# Patient Record
Sex: Male | Born: 1995 | Race: Black or African American | Hispanic: No | Marital: Single | Smoking: Never smoker
Health system: Southern US, Community
[De-identification: ages and names within clinical notes are randomized; demographics above are authoritative.]

## PROBLEM LIST (undated history)

## (undated) DIAGNOSIS — J45909 Unspecified asthma, uncomplicated: Secondary | ICD-10-CM

---
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
## 2019-02-12 ENCOUNTER — Emergency Department (HOSPITAL_BASED_OUTPATIENT_CLINIC_OR_DEPARTMENT_OTHER)
Admission: EM | Admit: 2019-02-12 | Discharge: 2019-02-12 | Disposition: A | Discharge status: Home / Self Care | Payer: Self-pay | Attending: Emergency Medicine | Admitting: Emergency Medicine

## 2019-02-12 ENCOUNTER — Encounter (HOSPITAL_BASED_OUTPATIENT_CLINIC_OR_DEPARTMENT_OTHER): Payer: Self-pay

## 2019-02-12 ENCOUNTER — Emergency Department (HOSPITAL_BASED_OUTPATIENT_CLINIC_OR_DEPARTMENT_OTHER): Payer: Self-pay

## 2019-02-12 ENCOUNTER — Other Ambulatory Visit: Payer: Self-pay

## 2019-02-12 DIAGNOSIS — J45909 Unspecified asthma, uncomplicated: Secondary | ICD-10-CM | POA: Insufficient documentation

## 2019-02-12 DIAGNOSIS — R197 Diarrhea, unspecified: Secondary | ICD-10-CM | POA: Insufficient documentation

## 2019-02-12 DIAGNOSIS — K5900 Constipation, unspecified: Secondary | ICD-10-CM | POA: Insufficient documentation

## 2019-02-12 DIAGNOSIS — R112 Nausea with vomiting, unspecified: Secondary | ICD-10-CM | POA: Insufficient documentation

## 2019-02-12 HISTORY — DX: Unspecified asthma, uncomplicated: J45.909

## 2019-02-12 LAB — COMPREHENSIVE METABOLIC PANEL
ALT: 17 U/L (ref 0–44)
AST: 18 U/L (ref 15–41)
Albumin: 4.5 g/dL (ref 3.5–5.0)
Alkaline Phosphatase: 69 U/L (ref 38–126)
Anion gap: 11 (ref 5–15)
BUN: 8 mg/dL (ref 6–20)
CO2: 28 mmol/L (ref 22–32)
Calcium: 9.4 mg/dL (ref 8.9–10.3)
Chloride: 102 mmol/L (ref 98–111)
Creatinine, Ser: 0.89 mg/dL (ref 0.61–1.24)
GFR calc Af Amer: >60 mL/min (ref >60)
GFR calc non Af Amer: >60 mL/min (ref >60)
Glucose, Bld: 109 mg/dL — ABNORMAL HIGH (ref 70–99)
Potassium: 3.2 mmol/L — ABNORMAL LOW (ref 3.5–5.1)
Sodium: 141 mmol/L (ref 135–145)
Total Bilirubin: 1 mg/dL (ref 0.3–1.2)
Total Protein: 7.9 g/dL (ref 6.5–8.1)

## 2019-02-12 LAB — CBC WITH DIFFERENTIAL/PLATELET
Abs Immature Granulocytes: 0.02 10*3/uL (ref 0.00–0.07)
Basophils Absolute: 0.1 10*3/uL (ref 0.0–0.1)
Basophils Relative: 1 %
Eosinophils Absolute: 0.2 10*3/uL (ref 0.0–0.5)
Eosinophils Relative: 2 %
HCT: 50.6 % (ref 39.0–52.0)
Hemoglobin: 16.1 g/dL (ref 13.0–17.0)
Immature Granulocytes: 0 %
Lymphocytes Relative: 13 %
Lymphs Abs: 1.5 10*3/uL (ref 0.7–4.0)
MCH: 26.6 pg (ref 26.0–34.0)
MCHC: 31.8 g/dL (ref 30.0–36.0)
MCV: 83.6 fL (ref 80.0–100.0)
Monocytes Absolute: 0.6 10*3/uL (ref 0.1–1.0)
Monocytes Relative: 6 %
Neutro Abs: 8.8 10*3/uL — ABNORMAL HIGH (ref 1.7–7.7)
Neutrophils Relative %: 78 %
Platelets: 308 10*3/uL (ref 150–400)
RBC: 6.05 MIL/uL — ABNORMAL HIGH (ref 4.22–5.81)
RDW: 13.2 % (ref 11.5–15.5)
WBC: 11.2 10*3/uL — ABNORMAL HIGH (ref 4.0–10.5)
nRBC: 0 % (ref 0.0–0.2)

## 2019-02-12 LAB — LIPASE, BLOOD: Lipase: 25 U/L (ref 11–51)

## 2019-02-12 MED ORDER — POLYETHYLENE GLYCOL 3350 17 G PO PACK: 17.0000 g | PACK | Freq: Every day | ORAL | 0 refills | Status: AC

## 2019-02-12 MED ORDER — POLYETHYLENE GLYCOL 3350 17 G PO PACK
17.0000 g | PACK | Freq: Every day | ORAL | Status: DC
  Filled 2019-02-12: qty 1

## 2019-02-12 MED ORDER — ONDANSETRON 8 MG PO TBDP: 8.0000 mg | ORAL_TABLET | Freq: Three times a day (TID) | ORAL | 0 refills | Status: AC | PRN

## 2019-02-12 MED ORDER — BISACODYL 10 MG RE SUPP: 10.0000 mg | RECTAL | 0 refills | Status: AC | PRN

## 2019-02-12 NOTE — ED Notes (Signed)
Pt to xray

## 2019-02-12 NOTE — ED Triage Notes (Signed)
Pt states starting Monday, abdominal pain, constipation, vomiting.  Took pepto bismal and mag citrate, unable to move bowels regularly.  Small bowel movement yesterday, states went a week without bm prior to that.  Vomited x2 today prior to arrival.  Denies visible blood. Passing gas

## 2019-02-12 NOTE — Discharge Instructions (Addendum)
We saw in the ER for constipation, nausea, vomiting.  At this time the emergency room work-up does not reveal any concerning findings.  We recommend that you take the nausea medicine along with the medications for constipation.  We also recommend that you increase fiber in your diet and drink plenty of water.  Return to the emergency room if you are unable to tolerate fluids or if your symptoms of pain get worse.  Also return to the ER if you are unable to pass gas.

## 2019-02-12 NOTE — ED Provider Notes (Signed)
[facility] EMERGENCY DEPARTMENT Provider Note   CSN: [id number] Arrival date & time: 02/12/19  0849    History   Chief Complaint Chief Complaint  Patient presents with  . Abdominal Pain  . Emesis    HPI Donald Tapia is a 23 y.o. male.     HPI 23 year old male with history of asthma comes in with chief complaint of constipation, vomiting. Patient reports that over the past 1 week he has noted some constipation.  He has been taking mag citrate over the past 2 days without significant relief.  He had passed a small amount of stool yesterday with straining.  He is passing flatus.  Patient has abdominal discomfort that is periumbilical.  He also has nausea and vomiting.  Patient reports that abdominal pain, nausea are provoked by p.o. intake and all of his episodes of emesis were postprandial.  He has no history of abdominal surgeries.  No history of similar pain.  In general he does not suffer from constipation.  Patient denies any narcotic pain use or substance abuse.  Past Medical History:  Diagnosis Date  . Asthma     There are no active problems to display for this patient.   History reviewed. No pertinent surgical history.      Home Medications    Prior to Admission medications   Medication Sig Start Date End Date Taking? Authorizing Provider  albuterol (VENTOLIN HFA) 108 (90 Base) MCG/ACT inhaler Inhale 2 puffs into the lungs every 4 (four) hours as needed for wheezing or shortness of breath.   Yes [provider]  bisacodyl (DULCOLAX) 10 MG suppository Place 1 suppository (10 mg total) rectally as needed for moderate constipation. 02/12/19   Derwood KaplanNanavati, Hagar Sadiq, MD  ondansetron (ZOFRAN ODT) 8 MG disintegrating tablet Take 1 tablet (8 mg total) by mouth every 8 (eight) hours as needed for nausea. 02/12/19   Derwood KaplanNanavati, Conor Lata, MD  polyethylene glycol (MIRALAX / GLYCOLAX) 17 g packet Take 17 g by mouth daily. 02/12/19   Derwood KaplanNanavati, Brihanna Devenport, MD    Family History  History reviewed. No pertinent family history.  Social History Social History   Tobacco Use  . Smoking status: Never Smoker  . Smokeless tobacco: Never Used  Substance Use Topics  . Alcohol use: Not Currently  . Drug use: Not Currently     Allergies   Patient has no known allergies.   Review of Systems Review of Systems  Constitutional: Positive for activity change.  Gastrointestinal: Positive for abdominal pain, constipation, nausea and vomiting.  Genitourinary: Negative for flank pain.  Neurological: Positive for light-headedness.  All other systems reviewed and are negative.    Physical Exam Updated Vital Signs BP 109/84 (BP Location: Right Arm)   Pulse 78   Temp 98.2 F (36.8 C) (Oral)   Resp 16   Ht 5\' 2"  (1.575 m)   Wt 59 kg   SpO2 100%   BMI 23.78 kg/m   Physical Exam Vitals signs and nursing note reviewed.  Constitutional:      Appearance: He is well-developed.  HENT:     Head: Atraumatic.  Neck:     Musculoskeletal: Neck supple.  Cardiovascular:     Rate and Rhythm: Normal rate.  Pulmonary:     Effort: Pulmonary effort is normal.  Abdominal:     Tenderness: There is abdominal tenderness in the epigastric area and periumbilical area. There is no guarding or rebound.  Skin:    General: Skin is warm.  Neurological:  Mental Status: He is alert and oriented to person, place, and time.      ED Treatments / Results  Labs (all labs ordered are listed, but only abnormal results are displayed) Labs Reviewed  CBC WITH DIFFERENTIAL/PLATELET - Abnormal; Notable for the following components:      Result Value   WBC 11.2 (*)    RBC 6.05 (*)    Neutro Abs 8.8 (*)    All other components within normal limits  COMPREHENSIVE METABOLIC PANEL - Abnormal; Notable for the following components:   Potassium 3.2 (*)    Glucose, Bld 109 (*)    All other components within normal limits  LIPASE, BLOOD    EKG None  Radiology Dg Abd Acute 2+v W 1v  Chest  Result Date: 02/12/2019 CLINICAL DATA:  Constipation, generalized abdominal pain, vomiting. EXAM: DG ABDOMEN ACUTE W/ 1V CHEST COMPARISON:  Radiographs of January 25, 2018. FINDINGS: There is no evidence of dilated bowel loops or free intraperitoneal air. No radiopaque calculi or other significant radiographic abnormality is seen. Heart size and mediastinal contours are within normal limits. Both lungs are clear. IMPRESSION: No evidence of bowel obstruction or ileus. No significant stool burden is noted. No acute cardiopulmonary disease. Electronically Signed   By: Lupita Raider M.D.   On: 02/12/2019 09:33    Procedures Procedures (including critical care time)  Medications Ordered in ED Medications  polyethylene glycol (MIRALAX / GLYCOLAX) packet 17 g (17 g Oral Not Given 02/12/19 1017)     Initial Impression / Assessment and Plan / ED Course  I have reviewed the triage vital signs and the nursing notes.  Pertinent labs & imaging results that were available during my care of the patient were reviewed by me and considered in my medical decision making (see chart for details).  Clinical Course as of Feb 11 1050  Wed Feb 12, 2019  1047 Pt reassessed. Pt's VSS and WNL. Pt's cap refill < 3 seconds. Pt has been hydrated in the ER and now passed po challenge. We will discharge with antiemetic. Strict ER return precautions have been discussed and pt will return if he is unable to tolerate fluids and symptoms are getting worse.    [AN]    Clinical Course User Index [AN] Derwood Kaplan, MD       23 year old male comes in with chief complaint of abdominal pain, nausea, vomiting and constipation.  It appears that he has been constipated for the last 1 week which is new for him.  Now he is having abdominal discomfort, nausea and vomiting with p.o. intake.  He also reports some dizziness.  There is no abdominal surgical history.  Patient's abdominal exam does not reveal any focal findings  of guarding or rebound and in general there is no peritoneal findings.  Plan is to get acute abdominal series and basic labs.  We will hydrate the patient in the ED.  He does not have any cough, chest pain, shortness of breath, fevers, chills, anosmia -thus we do not think patient has COVID-19 infection.  Final Clinical Impressions(s) / ED Diagnoses   Final diagnoses:  Constipation, unspecified constipation type  Non-intractable vomiting with nausea, unspecified vomiting type    ED Discharge Orders         Ordered    polyethylene glycol (MIRALAX / GLYCOLAX) 17 g packet  Daily     02/12/19 1045    bisacodyl (DULCOLAX) 10 MG suppository  As needed     02/12/19 1045  ondansetron (ZOFRAN ODT) 8 MG disintegrating tablet  Every 8 hours PRN     02/12/19 1045           Derwood Kaplan, MD 02/12/19 1051

## 2020-08-18 IMAGING — CR DG ABDOMEN ACUTE W/ 1V CHEST
3 series · 3 of 3 positions shown · non-contrast
Comparison: Radiographs of January 25, 2018.

CLINICAL DATA: Constipation, generalized abdominal pain, vomiting.

EXAM:
DG ABDOMEN ACUTE W/ 1V CHEST

[w chest pa]
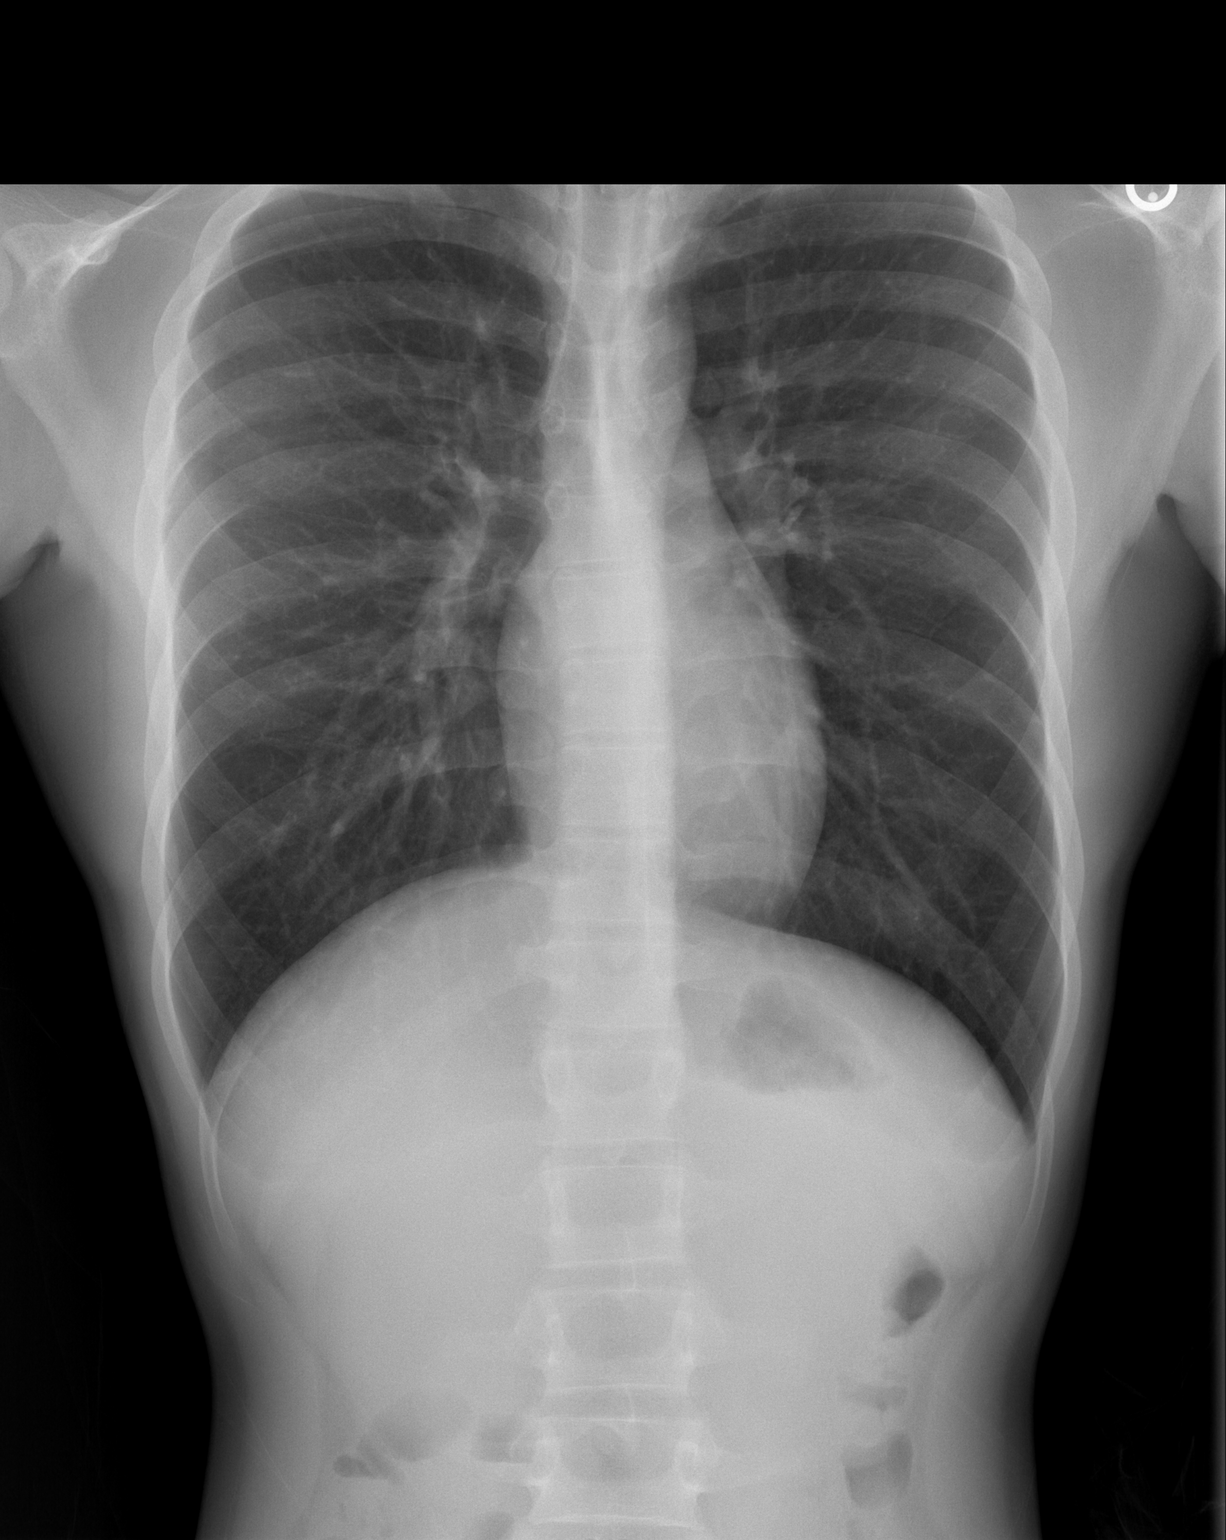

[w abdomen upright]
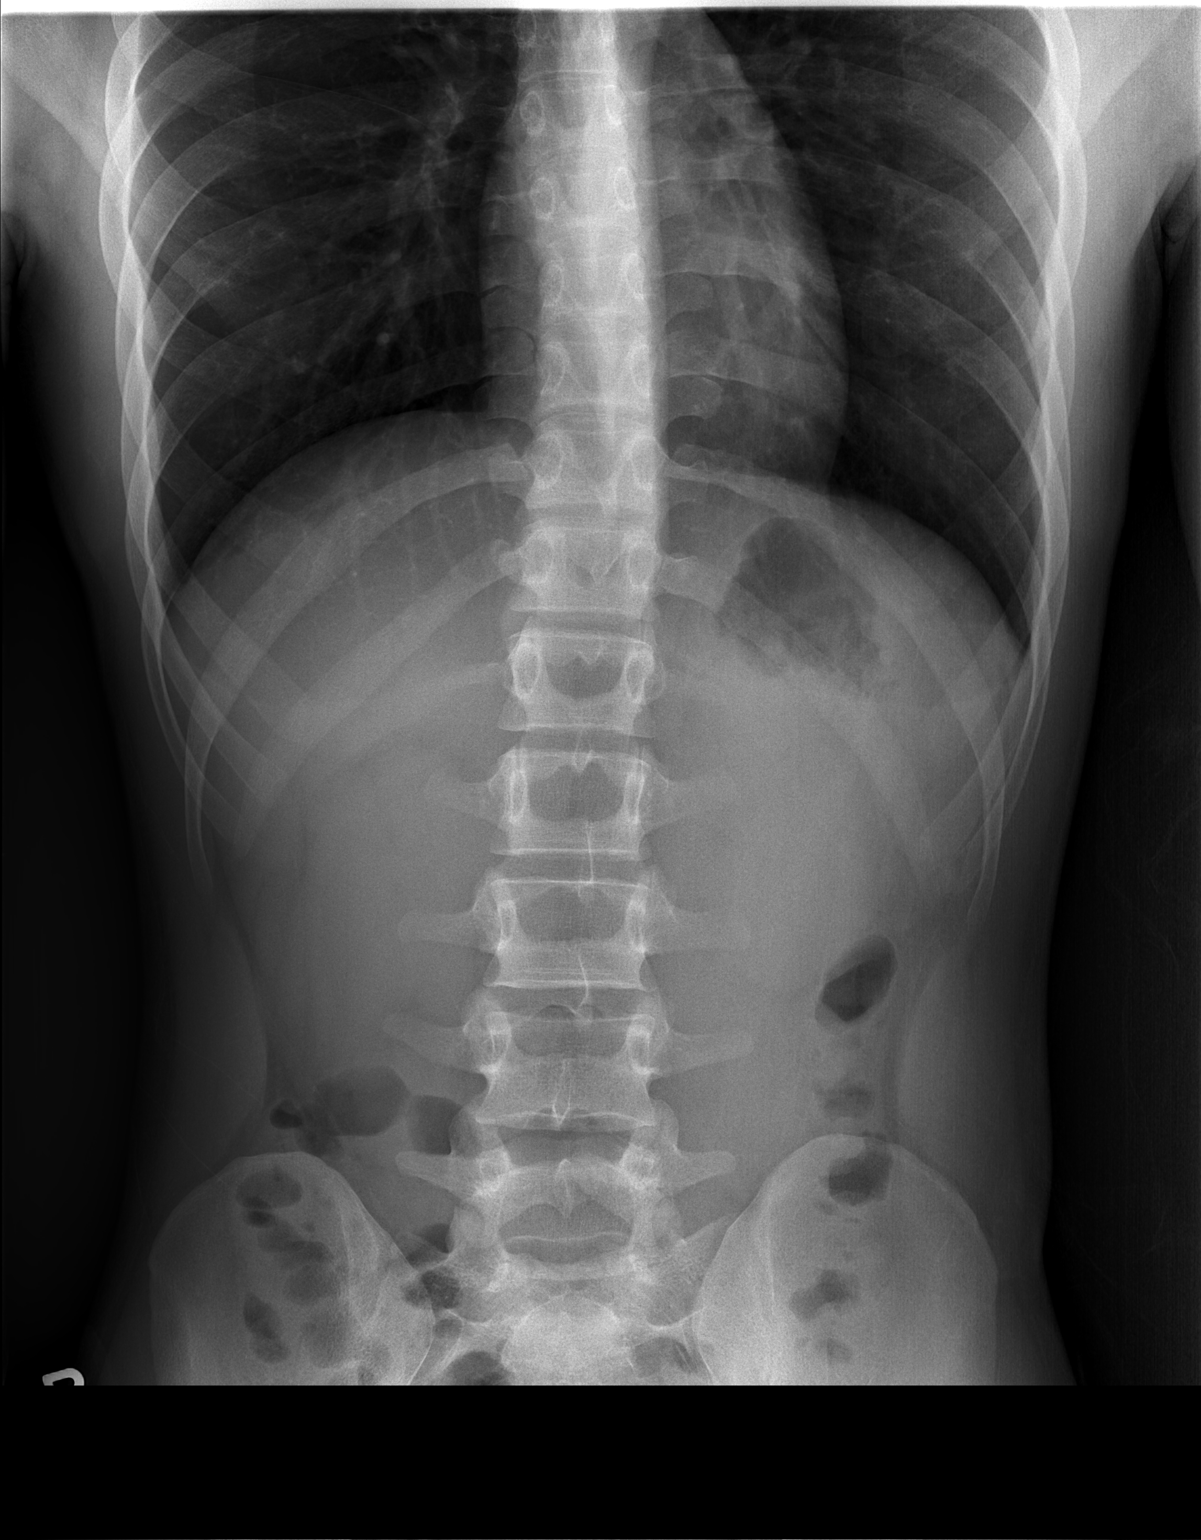

[t abdomen supine]
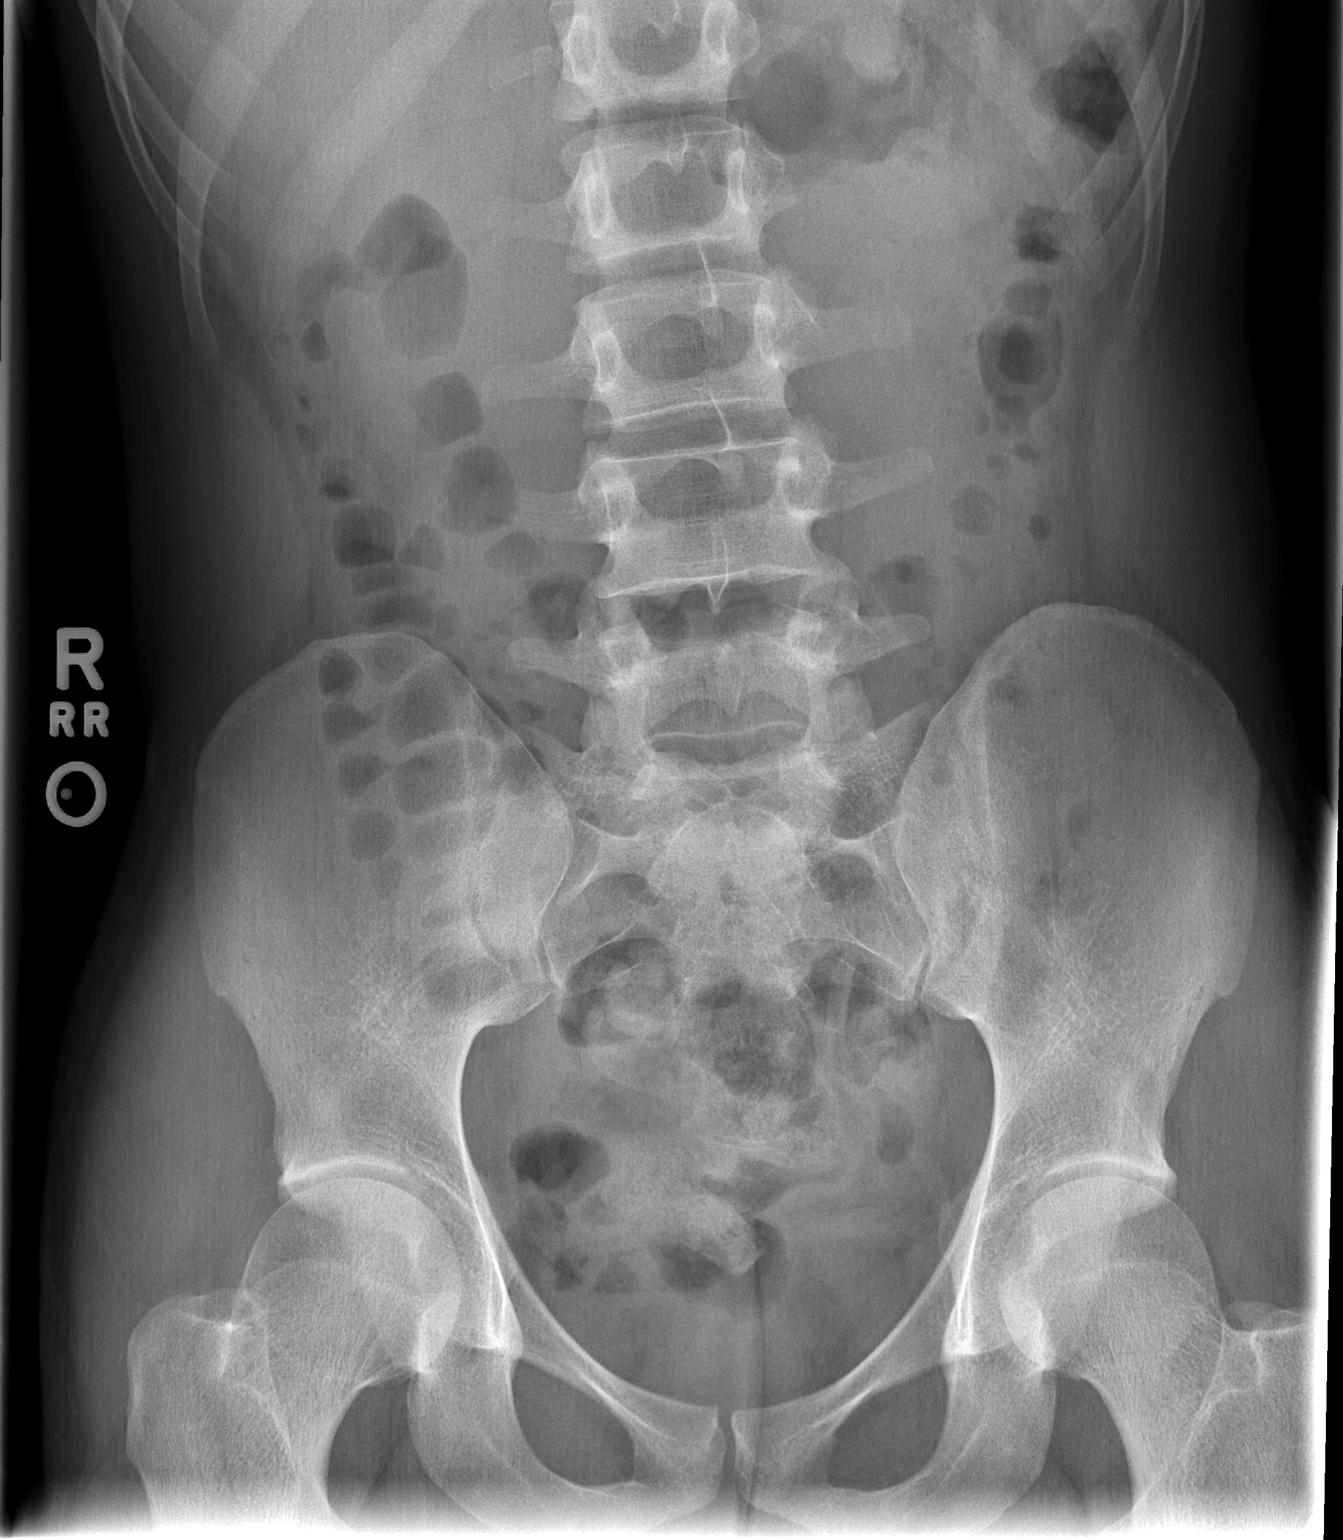

[3 of 3 positions shown; findings below may reference images not displayed]

FINDINGS: There is no evidence of dilated bowel loops or free intraperitoneal
air. No radiopaque calculi or other significant radiographic
abnormality is seen. Heart size and mediastinal contours are within
normal limits. Both lungs are clear.
IMPRESSION: No evidence of bowel obstruction or ileus. No significant stool
burden is noted. No acute cardiopulmonary disease.
